# Patient Record
Sex: Male | Born: 1950 | Race: White | Hispanic: No | Marital: Married
Health system: Southern US, Community
[De-identification: ages and names within clinical notes are randomized; demographics above are authoritative.]

## PROBLEM LIST (undated history)

## (undated) DIAGNOSIS — E119 Type 2 diabetes mellitus without complications: Secondary | ICD-10-CM

## (undated) DIAGNOSIS — E7800 Pure hypercholesterolemia, unspecified: Secondary | ICD-10-CM

---

## 2018-01-07 ENCOUNTER — Emergency Department (HOSPITAL_COMMUNITY)
Admission: EM | Admit: 2018-01-07 | Discharge: 2018-01-07 | Disposition: A | Discharge status: Home / Self Care | Payer: Managed Care, Other (non HMO) | Attending: Emergency Medicine | Admitting: Emergency Medicine

## 2018-01-07 ENCOUNTER — Emergency Department (HOSPITAL_COMMUNITY): Payer: Managed Care, Other (non HMO)

## 2018-01-07 ENCOUNTER — Encounter (HOSPITAL_COMMUNITY): Payer: Self-pay

## 2018-01-07 ENCOUNTER — Other Ambulatory Visit: Payer: Self-pay

## 2018-01-07 DIAGNOSIS — R410 Disorientation, unspecified: Secondary | ICD-10-CM | POA: Insufficient documentation

## 2018-01-07 DIAGNOSIS — E119 Type 2 diabetes mellitus without complications: Secondary | ICD-10-CM | POA: Diagnosis not present

## 2018-01-07 HISTORY — DX: Pure hypercholesterolemia, unspecified: E78.00

## 2018-01-07 HISTORY — DX: Type 2 diabetes mellitus without complications: E11.9

## 2018-01-07 LAB — CBC
HEMATOCRIT: 41.1 % (ref 39.0–52.0)
Hemoglobin: 14.4 g/dL (ref 13.0–17.0)
MCH: 31.3 pg (ref 26.0–34.0)
MCHC: 35 g/dL (ref 30.0–36.0)
MCV: 89.3 fL (ref 78.0–100.0)
Platelets: 187 10*3/uL (ref 150–400)
RBC: 4.6 MIL/uL (ref 4.22–5.81)
RDW: 12.2 % (ref 11.5–15.5)
WBC: 6.6 10*3/uL (ref 4.0–10.5)

## 2018-01-07 LAB — DIFFERENTIAL
BASOS PCT: 1 %
Basophils Absolute: 0 10*3/uL (ref 0.0–0.1)
Eosinophils Absolute: 0.3 10*3/uL (ref 0.0–0.7)
Eosinophils Relative: 5 %
LYMPHS PCT: 23 %
Lymphs Abs: 1.5 10*3/uL (ref 0.7–4.0)
MONO ABS: 0.4 10*3/uL (ref 0.1–1.0)
MONOS PCT: 5 %
Neutro Abs: 4.4 10*3/uL (ref 1.7–7.7)
Neutrophils Relative %: 66 %

## 2018-01-07 LAB — COMPREHENSIVE METABOLIC PANEL
ALK PHOS: 91 U/L (ref 38–126)
ALT: 50 U/L (ref 17–63)
AST: 36 U/L (ref 15–41)
Albumin: 4.2 g/dL (ref 3.5–5.0)
Anion gap: 9 (ref 5–15)
BILIRUBIN TOTAL: 0.8 mg/dL (ref 0.3–1.2)
BUN: 29 mg/dL — ABNORMAL HIGH (ref 6–20)
CALCIUM: 9.7 mg/dL (ref 8.9–10.3)
CO2: 27 mmol/L (ref 22–32)
Chloride: 105 mmol/L (ref 101–111)
Creatinine, Ser: 1.05 mg/dL (ref 0.61–1.24)
Glucose, Bld: 81 mg/dL (ref 65–99)
Potassium: 4.7 mmol/L (ref 3.5–5.1)
Sodium: 141 mmol/L (ref 135–145)
TOTAL PROTEIN: 6.5 g/dL (ref 6.5–8.1)

## 2018-01-07 LAB — I-STAT TROPONIN, ED: TROPONIN I, POC: 0 ng/mL (ref 0.00–0.08)

## 2018-01-07 LAB — APTT: aPTT: 29 seconds (ref 24–36)

## 2018-01-07 LAB — PROTIME-INR
INR: 0.96
Prothrombin Time: 12.6 seconds (ref 11.4–15.2)

## 2018-01-07 MED ORDER — ASPIRIN EC 325 MG PO TBEC
325.0000 mg | DELAYED_RELEASE_TABLET | Freq: Once | ORAL | Status: AC
  Administered 2018-01-07: 325 mg via ORAL
  Filled 2018-01-07: qty 1

## 2018-01-07 MED ORDER — ASPIRIN EC 325 MG PO TBEC: 325.0000 mg | DELAYED_RELEASE_TABLET | Freq: Every day | ORAL | 0 refills | Status: AC

## 2018-01-07 NOTE — ED Notes (Signed)
Pt observed to have no neuro deficits at front desk, ambulatory, answered all questions appropriately.

## 2018-01-07 NOTE — ED Provider Notes (Signed)
[facility] EMERGENCY DEPARTMENT Provider Note   CSN: [id number] Arrival date & time: 01/07/18  1218     History   Chief Complaint No chief complaint on file.   HPI Noah Sanford is a 67 y.o. male.  Pt presents to the ED today with an episode of confusion that occurred this morning upon wakening.  The pt lives in [place] and worked a lot prior to driving down to  this weekend to help his mom move.  The pt slept in a little today and was looking at his phone and could not figure out his phone and the date.  He asked his wife about it about 4 times.  This is very unusual for patient.  After eating, he seemed to be better.  They were planning to drive home to [place] today, but were worried about driving for 7 hours with the problem he had this morning.  Pt has been in the waiting room for almost 8 hours and has been fine.  He never had trouble speaking or walking.        Past Medical History:  Diagnosis Date  . Diabetes mellitus without complication (HCC)   . High cholesterol     There are no active problems to display for this patient.   History reviewed. No pertinent surgical history.     Home Medications    Prior to Admission medications   Medication Sig Start Date End Date Taking? Authorizing Provider  aspirin EC 325 MG tablet Take 1 tablet (325 mg total) by mouth daily. 01/07/18   Jacalyn Lefevre, MD    Family History No family history on file.  Social History Social History   Tobacco Use  . Smoking status: Not on file  Substance Use Topics  . Alcohol use: Not on file  . Drug use: Not on file     Allergies   Patient has no allergy information on record.   Review of Systems Review of Systems  Neurological:       Confusion  All other systems reviewed and are negative.    Physical Exam Updated Vital Signs BP (!) 153/79 (BP Location: Left Arm)   Pulse 74   Temp 98.1 F (36.7 C) (Oral)   Resp 14   SpO2 98%   Physical Exam   Constitutional: He is oriented to person, place, and time. He appears well-developed and well-nourished.  HENT:  Head: Normocephalic and atraumatic.  Right Ear: External ear normal.  Left Ear: External ear normal.  Nose: Nose normal.  Mouth/Throat: Oropharynx is clear and moist.  Eyes: Conjunctivae and EOM are normal. Pupils are equal, round, and reactive to light.  Neck: Normal range of motion. Neck supple.  Cardiovascular: Normal rate, regular rhythm, normal heart sounds and intact distal pulses.  Pulmonary/Chest: Effort normal and breath sounds normal.  Abdominal: Soft. Bowel sounds are normal.  Musculoskeletal: Normal range of motion.  Neurological: He is alert and oriented to person, place, and time.  Skin: Skin is warm. Capillary refill takes less than 2 seconds.  Psychiatric: He has a normal mood and affect. His behavior is normal. Judgment and thought content normal.  Nursing note and vitals reviewed.    ED Treatments / Results  Labs (all labs ordered are listed, but only abnormal results are displayed) Labs Reviewed  COMPREHENSIVE METABOLIC PANEL - Abnormal; Notable for the following components:      Result Value   BUN 29 (*)    All other components within  normal limits  CBC  DIFFERENTIAL  PROTIME-INR  APTT  I-STAT TROPONIN, ED    EKG  EKG Interpretation  Date/Time:  Monday January 07 2018 12:53:25 EST Ventricular Rate:  77 PR Interval:  172 QRS Duration: 86 QT Interval:  358 QTC Calculation: 405 R Axis:   66 Text Interpretation:  Normal sinus rhythm Minimal voltage criteria for LVH, may be normal variant Borderline ECG No old tracing to compare Confirmed by Jacalyn LefevreHaviland, Genelle Economou [phone]) on 01/07/2018 7:28:07 PM       Radiology Ct Head Wo Contrast  Result Date: 01/07/2018 CLINICAL DATA:  Focal neuro deficit greater than 6 hours. Possible stroke. Headache. EXAM: CT HEAD WITHOUT CONTRAST TECHNIQUE: Contiguous axial images were obtained from the base of the skull  through the vertex without intravenous contrast. COMPARISON:  None. FINDINGS: Brain: No evidence of acute infarction, hemorrhage, hydrocephalus, extra-axial collection or mass lesion/mass effect. Vascular: Negative for hyperdense vessel Skull: Negative Sinuses/Orbits: Mild mucosal edema paranasal sinuses.  Normal orbit Other: None IMPRESSION: Negative CT head Electronically Signed   By: Marlan Palauharles  Clark M.D.   On: 01/07/2018 13:42    Procedures Procedures (including critical care time)  Medications Ordered in ED Medications  aspirin EC tablet 325 mg (not administered)     Initial Impression / Assessment and Plan / ED Course  I have reviewed the triage vital signs and the nursing notes.  Pertinent labs & imaging results that were available during my care of the patient were reviewed by me and considered in my medical decision making (see chart for details).     Work up normal.  Pt offered MRI, but he wants to get that in [place].  He is encouraged to start ECASA to prevent CVA and TIA.  He knows to f/u with his doctor when he gets back home.  Final Clinical Impressions(s) / ED Diagnoses   Final diagnoses:  Confusion    ED Discharge Orders        Ordered    aspirin EC 325 MG tablet  Daily     01/07/18 Ezra [id number]       Lexander Tremblay, MD 01/07/18 2006

## 2018-01-07 NOTE — ED Triage Notes (Signed)
Patient complains of awakening with confusion this am. Spouse states that patient couldn't remember the day of the week and has had repetitive questioning. Steady gait, no neuro deficits. Alert and oriented on arrival but slow to answer questions. Denies pain. LVO neg

## 2018-01-07 NOTE — ED Provider Notes (Signed)
Patient placed in Quick Look pathway, seen and evaluated   Chief Complaint: confusion  HPI:   Pt woke up this morning confused, asking family similar questions. Does not remember if he took his medications this morning which is unusual. No hx of the same. Reports palpitations several days ago. Ablation 20ish years ago, no problems since.   ROS: Positive for confusion, headache, palpitations.   Physical Exam:   Gen: No distress  Neuro: Awake and Alert.    Skin: Warm  Lungs clear to ausculation bialterally  Regular HR and rhythm.     Focused Exam: AAOx3. Speech normal. Cranial nerves intact. 5/5 and equal bilateral upper and lower extremity strength.    Initiation of care has begun. The patient has been counseled on the process, plan, and necessity for staying for the completion/evaluation, and the remainder of the medical screening examination  Symptoms concerning for possible CVA, although currently no exam findings. Risk factors, HTN, DM, hyperlipidimia. Labs, CT, ECG ordered    Iona CoachKirichenko, Tarshia Kot, PA-C 01/07/18 1303    Benjiman CorePickering, Nathan, MD 01/07/18 2258

## 2018-01-07 NOTE — ED Notes (Signed)
Requested urine specimen from patient

## 2019-04-20 IMAGING — CT CT HEAD W/O CM
4 series · 15 of 47 positions shown, 17 images · non-contrast
Comparison: None.

CLINICAL DATA: Focal neuro deficit greater than 6 hours. Possible
stroke. Headache.

EXAM:
CT HEAD WITHOUT CONTRAST
TECHNIQUE: Contiguous axial images were obtained from the base of the skull
through the vertex without intravenous contrast.

[Series 3: head without · axial · non-contrast · 0.49mm/px · z∈[-112,+13]mm · 7 of 35 slices shown, 9 images]
[im 5/35  brain]
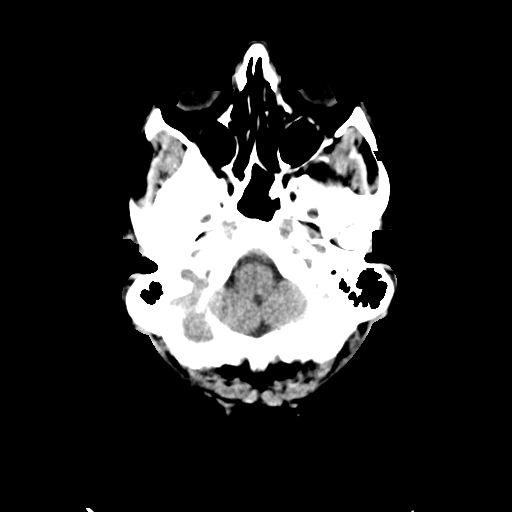
[im 5/35  bone]
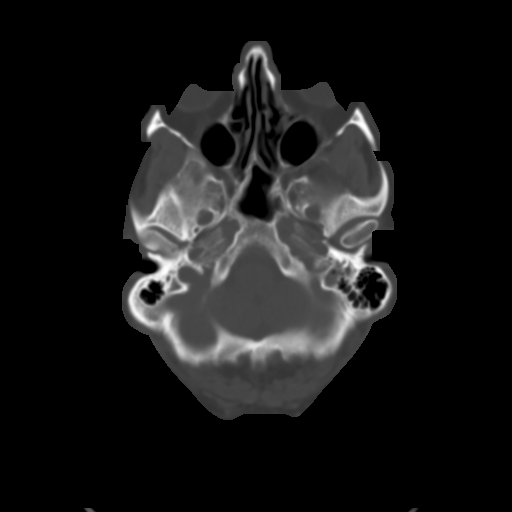
[im 9/35  brain]
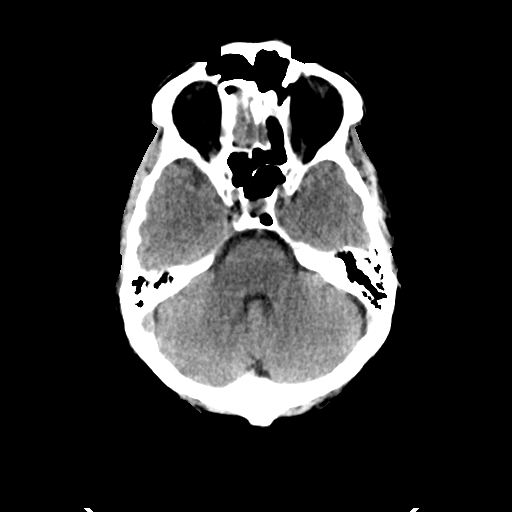
[im 13/35  brain]
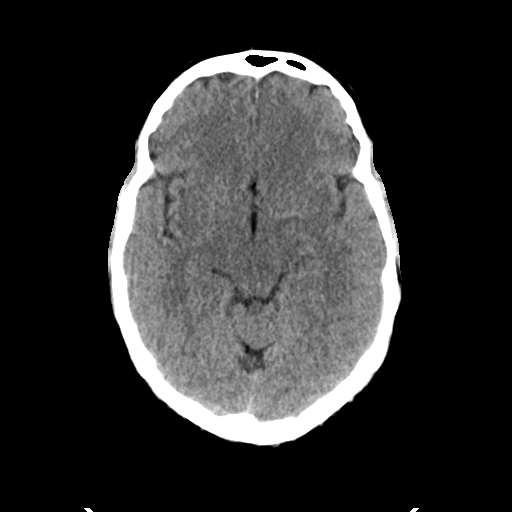
[im 18/35  brain]
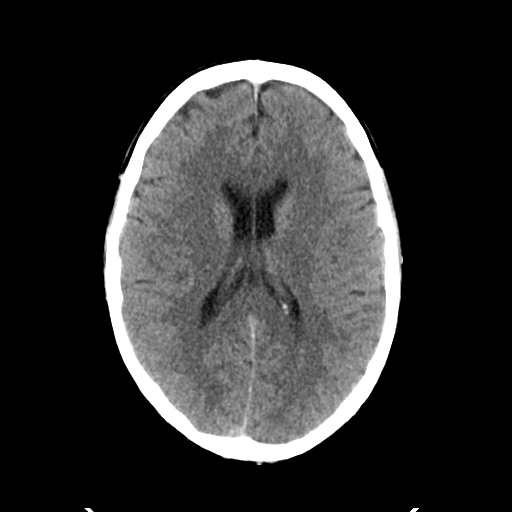
[im 22/35  brain]
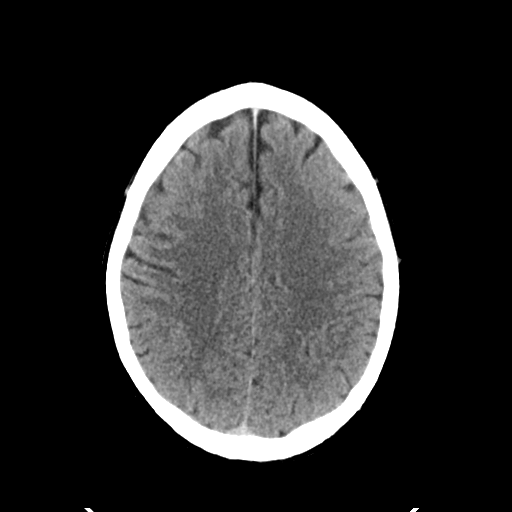
[im 22/35  bone]
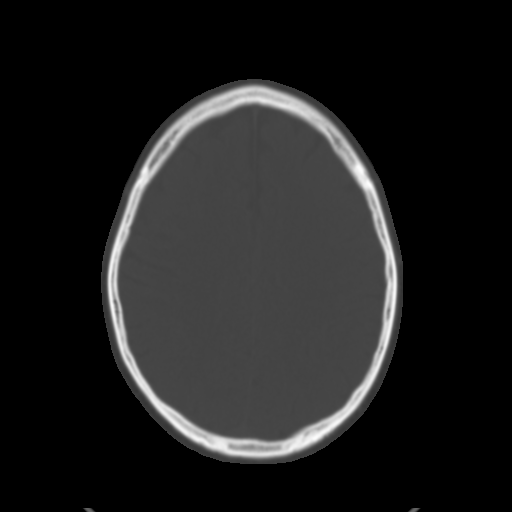
[im 26/35  brain]
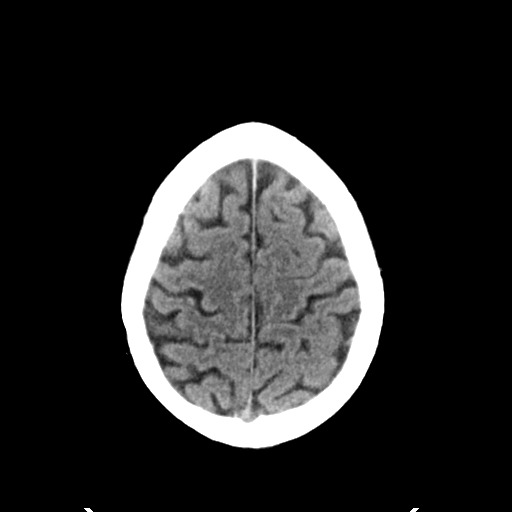
[im 30/35  brain]
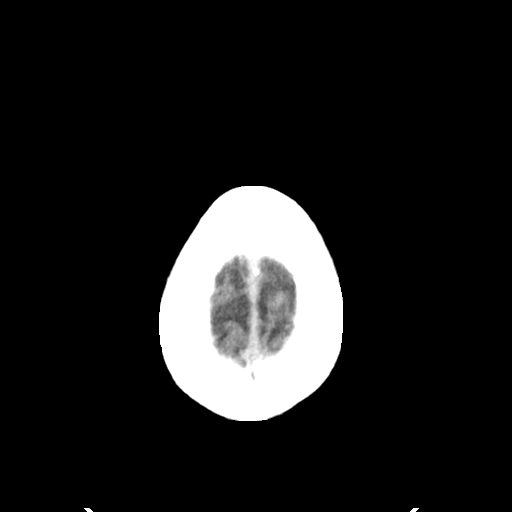

[Series 4: head bone · axial · 0.49mm/px · z∈[-116,-98]mm · 2 of 87 slices shown]
[im 9/87  bone]
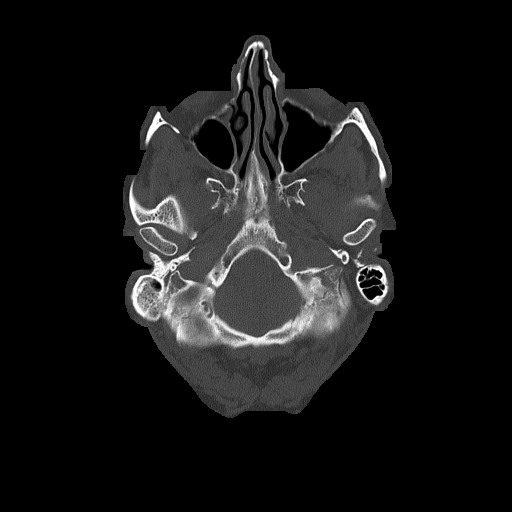
[im 18/87  bone]
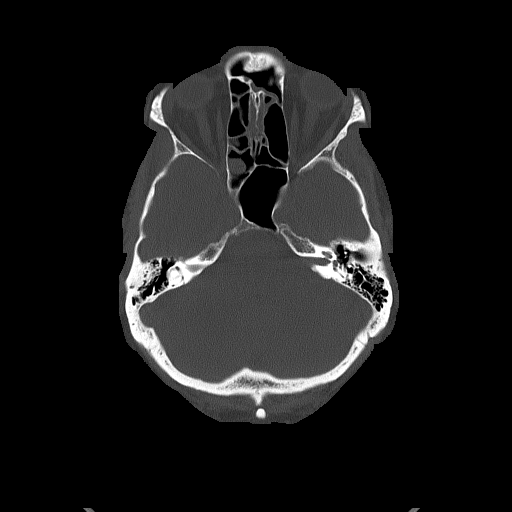

[Series 5: head without cor · coronal · non-contrast · 0.32mm/px · 3 of 67 slices shown]
[im 23/67  brain]
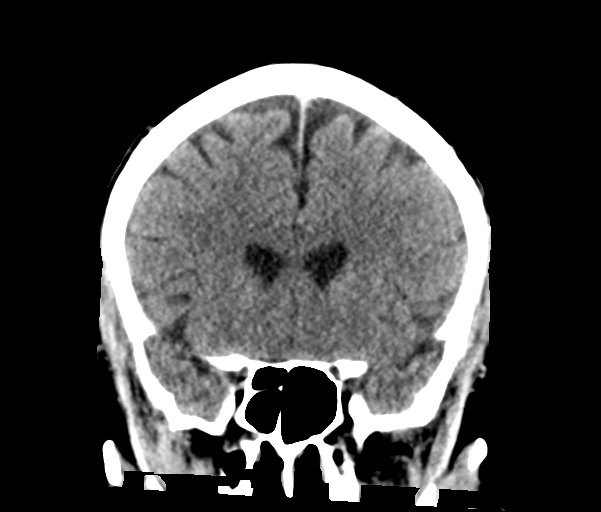
[im 30/67  brain]
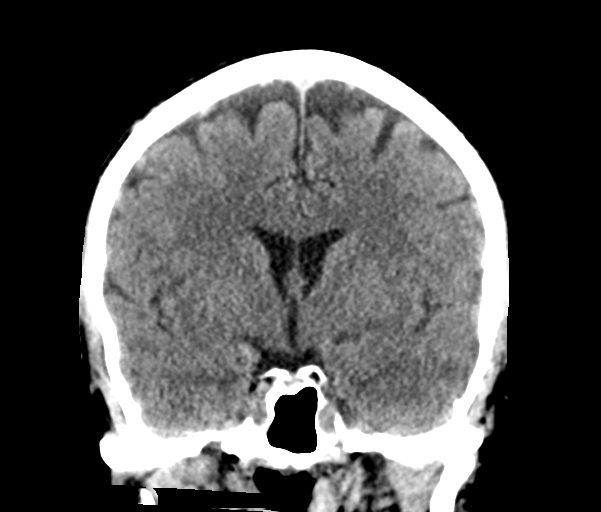
[im 37/67  brain]
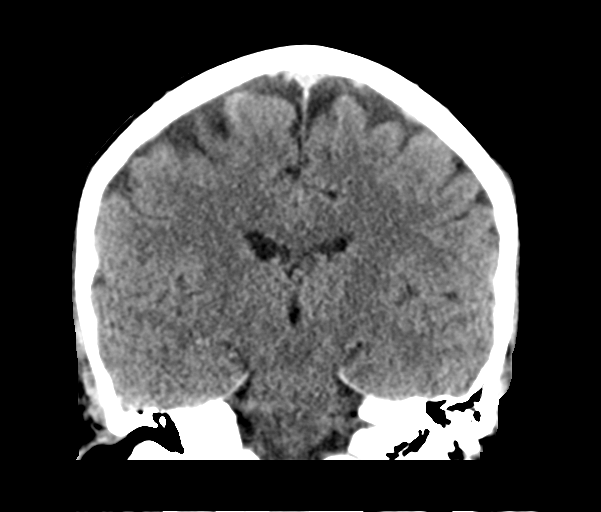

[Series 6: head without sag · sagittal · non-contrast · 0.34mm/px · 3 of 67 slices shown]
[im 23/67  brain]
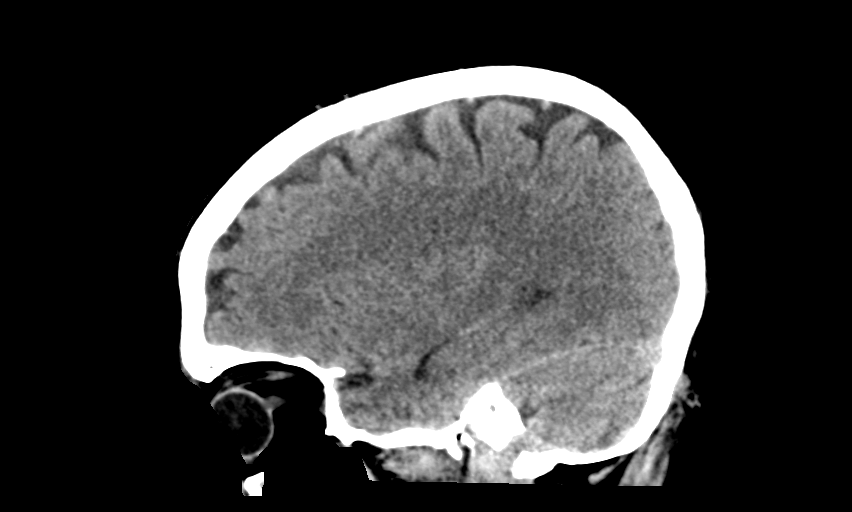
[im 34/67  brain]
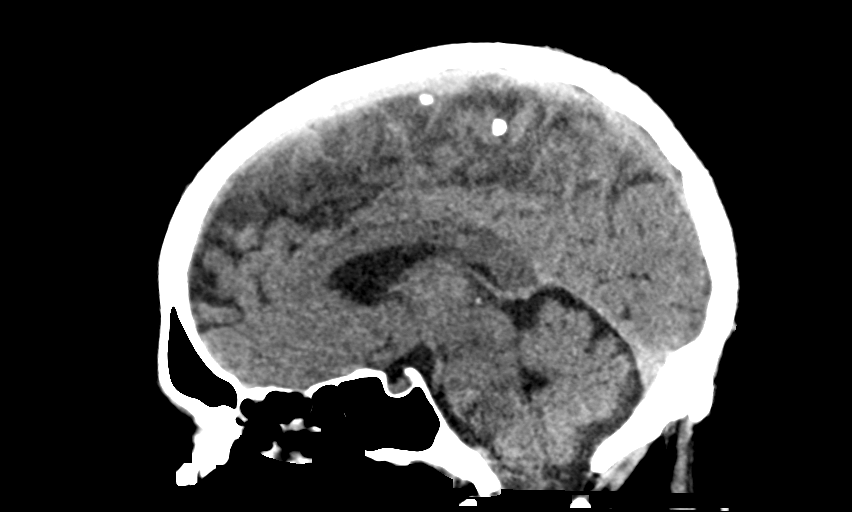
[im 45/67  brain]
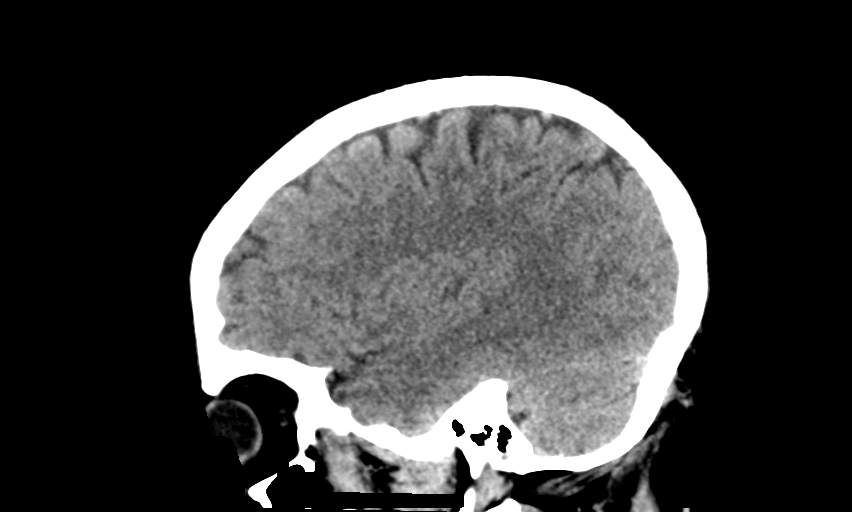

[15 of 47 positions shown; findings below may reference images not displayed]

FINDINGS: Brain: No evidence of acute infarction, hemorrhage, hydrocephalus,
extra-axial collection or mass lesion/mass effect.

Vascular: Negative for hyperdense vessel

Skull: Negative

Sinuses/Orbits: Mild mucosal edema paranasal sinuses.  Normal orbit

Other: None
IMPRESSION: Negative CT head
